# Patient Record
Sex: Male | Born: 1945 | Race: Black or African American | Hispanic: No | Marital: Married | Smoking: Never smoker
Health system: Southern US, Community
[De-identification: ages and names within clinical notes are randomized; demographics above are authoritative.]

## PROBLEM LIST (undated history)

## (undated) DIAGNOSIS — I10 Essential (primary) hypertension: Secondary | ICD-10-CM

## (undated) DIAGNOSIS — N281 Cyst of kidney, acquired: Secondary | ICD-10-CM

## (undated) DIAGNOSIS — K4090 Unilateral inguinal hernia, without obstruction or gangrene, not specified as recurrent: Secondary | ICD-10-CM

## (undated) DIAGNOSIS — T7840XA Allergy, unspecified, initial encounter: Secondary | ICD-10-CM

## (undated) DIAGNOSIS — C61 Malignant neoplasm of prostate: Secondary | ICD-10-CM

## (undated) HISTORY — DX: Cyst of kidney, acquired: N28.1

## (undated) HISTORY — DX: Allergy, unspecified, initial encounter: T78.40XA

## (undated) HISTORY — DX: Essential (primary) hypertension: I10

## (undated) HISTORY — DX: Unilateral inguinal hernia, without obstruction or gangrene, not specified as recurrent: K40.90

## (undated) HISTORY — DX: Malignant neoplasm of prostate: C61

## (undated) HISTORY — PX: HERNIA REPAIR: SHX51

## (undated) HISTORY — PX: PROSTATE SURGERY: SHX751

## (undated) HISTORY — PX: UPPER GASTROINTESTINAL ENDOSCOPY: SHX188

---

## 2019-08-13 ENCOUNTER — Ambulatory Visit (HOSPITAL_COMMUNITY)
Admission: EM | Admit: 2019-08-13 | Discharge: 2019-08-13 | Disposition: A | Discharge status: Home / Self Care | Payer: Medicare HMO | Attending: Family Medicine | Admitting: Family Medicine

## 2019-08-13 ENCOUNTER — Other Ambulatory Visit: Payer: Self-pay

## 2019-08-13 ENCOUNTER — Encounter (HOSPITAL_COMMUNITY): Payer: Self-pay

## 2019-08-13 DIAGNOSIS — I10 Essential (primary) hypertension: Secondary | ICD-10-CM | POA: Diagnosis not present

## 2019-08-13 DIAGNOSIS — R7301 Impaired fasting glucose: Secondary | ICD-10-CM

## 2019-08-13 DIAGNOSIS — G4733 Obstructive sleep apnea (adult) (pediatric): Secondary | ICD-10-CM

## 2019-08-13 DIAGNOSIS — Z8546 Personal history of malignant neoplasm of prostate: Secondary | ICD-10-CM

## 2019-08-13 MED ORDER — DILTIAZEM HCL 30 MG PO TABS: ORAL_TABLET | ORAL | 0 refills | Status: DC

## 2019-08-13 NOTE — Discharge Instructions (Addendum)
Take the BP medicine as directed Call for a primary care appointment tomorrow, to be seen this month

## 2019-08-13 NOTE — ED Triage Notes (Signed)
Pt states his B/P was up about an hour ago ..175/100. pt state he was at the Chiropractic    Office. Pt state he's under a lot of stress.

## 2019-08-13 NOTE — ED Provider Notes (Signed)
[place]    CSN: [id number] Arrival date & time: 08/13/19  1530      History   Chief Complaint Chief Complaint  Patient presents with  . Hypertension    HPI Zachary Barker is a 74 y.o. male.   HPI    Patient is here for hypertension.  He went to a chiropractor today for neck pain.  The chiropractor took his blood pressure and told him it was 180/100.  He recognizes this is a high blood pressure.  He states that he has been under a lot of stress.  He did not have any headache, dizziness, visual symptoms, or nausea.  He does not have any heart disease, with no chest pain or shortness of breath, no pedal edema I reviewed the records from [place].  He saw his internal medicine doctor couple months ago.  His blood pressure at that time was 150/94.  He was taking a natural supplement and trying to work on his diet.  He states he has been on a number of blood pressure medications.  He has tried and failed hydrochlorothiazide, lisinopril, and amlodipine.  They all caused unacceptable side effects for him. He just moved to the [place] area.  He has not yet established with a new primary care doctor. We talked about diet.  Exercise.  Avoiding salt.  The medical complications of awaiting treatment for hypertension, or living with hypertension over time.  Risks of heart disease and stroke, vascular disease. History reviewed. No pertinent past medical history.  Patient Active Problem List   Diagnosis Date Noted  . Essential hypertension 08/13/2019  . History of prostate cancer 08/13/2019  . Impaired fasting glucose 08/13/2019  . OSA (obstructive sleep apnea) 08/13/2019    History reviewed. No pertinent surgical history.     Home Medications    Prior to Admission medications   Medication Sig Start Date End Date Taking? Authorizing Provider  diltiazem (CARDIZEM) 30 MG tablet Take one tab BID for one week then increase to 2 tabs BID 08/13/19   Raylene Everts, MD     Family History History reviewed. No pertinent family history.  Social History Social History   Tobacco Use  . Smoking status: Never Smoker  . Smokeless tobacco: Never Used  Substance Use Topics  . Alcohol use: Never  . Drug use: Never     Allergies   Patient has no known allergies.   Review of Systems Review of Systems  Eyes: Negative for photophobia and visual disturbance.  Cardiovascular: Negative for chest pain and leg swelling.  Gastrointestinal: Negative for nausea.  Musculoskeletal: Positive for neck pain and neck stiffness.  Neurological: Negative for dizziness and headaches.     Physical Exam Triage Vital Signs ED Triage Vitals  Enc Vitals Group     BP 08/13/19 1608 (!) 178/81     Pulse Rate 08/13/19 1608 71     Resp 08/13/19 1608 18     Temp 08/13/19 1608 98.3 F (36.8 C)     Temp src --      SpO2 08/13/19 1608 100 %     Weight 08/13/19 1609 190 lb (86.2 kg)     Height --      Head Circumference --      Peak Flow --      Pain Score 08/13/19 1609 0     Pain Loc --      Pain Edu? --      Excl. in [place]? --  No data found.  Updated Vital Signs BP (!) 178/81 (BP Location: Right Arm)   Pulse 71   Temp 98.3 F (36.8 C)   Resp 18   Wt 86.2 kg   SpO2 100%     Physical Exam Constitutional:      General: He is not in acute distress.    Appearance: Normal appearance. He is well-developed.     Comments: Mild overweight  HENT:     Head: Normocephalic and atraumatic.     Mouth/Throat:     Comments: Mask in place Eyes:     Conjunctiva/sclera: Conjunctivae normal.     Pupils: Pupils are equal, round, and reactive to light.  Cardiovascular:     Rate and Rhythm: Normal rate and regular rhythm.     Heart sounds: Normal heart sounds. No murmur.     Comments: 1 ectopic beat and 60 seconds auscultation Pulmonary:     Effort: Pulmonary effort is normal. No respiratory distress.     Breath sounds: Normal breath sounds.     Comments: Lungs are  clear Abdominal:     General: There is no distension.     Palpations: Abdomen is soft.     Comments: No organomegaly  Musculoskeletal:        General: Normal range of motion.     Cervical back: Normal range of motion.  Skin:    General: Skin is warm and dry.  Neurological:     General: No focal deficit present.     Mental Status: He is alert.  Psychiatric:        Mood and Affect: Mood normal.        Behavior: Behavior normal.      UC Treatments / Results  Labs (all labs ordered are listed, but only abnormal results are displayed) Labs Reviewed - No data to display  EKG   Radiology No results found.  Procedures Procedures (including critical care time)  Medications Ordered in UC Medications - No data to display  Initial Impression / Assessment and Plan / UC Course  I have reviewed the triage vital signs and the nursing notes.  Pertinent labs & imaging results that were available during my care of the patient were reviewed by me and considered in my medical decision making (see chart for details).     See HPI Final Clinical Impressions(s) / UC Diagnoses   Final diagnoses:  Essential hypertension     Discharge Instructions     Take the BP medicine as directed Call for a primary care appointment tomorrow, to be seen this month   ED Prescriptions    Medication Sig Dispense Auth. Provider   diltiazem (CARDIZEM) 30 MG tablet Take one tab BID for one week then increase to 2 tabs BID 120 tablet Raylene Everts, MD     PDMP not reviewed this encounter.   Raylene Everts, MD 08/13/19 1929

## 2019-08-23 ENCOUNTER — Ambulatory Visit: Payer: Medicare HMO | Attending: Internal Medicine

## 2019-08-23 DIAGNOSIS — Z23 Encounter for immunization: Secondary | ICD-10-CM | POA: Insufficient documentation

## 2019-08-23 NOTE — Progress Notes (Signed)
   Covid-19 Vaccination Clinic  Name:  Zachary Barker    MRN: [id number] DOB: 1945-11-01  08/23/2019  [name] was observed post Covid-19 immunization for 15 minutes without incidence. He was provided with Vaccine Information Sheet and instruction to access the V-Safe system.   Mr. Hearing was instructed to call 911 with any severe reactions post vaccine: Marland Kitchen Difficulty breathing  . Swelling of your face and throat  . A fast heartbeat  . A bad rash all over your body  . Dizziness and weakness    Immunizations Administered    Name Date Dose VIS Date Route   Pfizer COVID-19 Vaccine 08/23/2019  2:24 PM 0.3 mL 06/08/2019 Intramuscular   Manufacturer: [place]   Lot: [id number]   [place]: [id number]

## 2019-09-18 ENCOUNTER — Ambulatory Visit: Payer: Medicare HMO | Attending: Internal Medicine

## 2019-09-18 DIAGNOSIS — Z23 Encounter for immunization: Secondary | ICD-10-CM

## 2019-09-18 NOTE — Progress Notes (Signed)
   Covid-19 Vaccination Clinic  Name:  Davy Deberg    MRN: [id number] DOB: 01-Mar-1946  09/18/2019  Mr. Macewan was observed post Covid-19 immunization for 15 minutes without incident. He was provided with Vaccine Information Sheet and instruction to access the V-Safe system.   Mr. Grammatico was instructed to call 911 with any severe reactions post vaccine: Marland Kitchen Difficulty breathing  . Swelling of face and throat  . A fast heartbeat  . A bad rash all over body  . Dizziness and weakness   Immunizations Administered    Name Date Dose VIS Date Route   Pfizer COVID-19 Vaccine 09/18/2019  1:56 PM 0.3 mL 06/08/2019 Intramuscular   Manufacturer: [place]   Lot: [id number]   [place]: [id number]

## 2020-02-20 ENCOUNTER — Other Ambulatory Visit: Payer: Self-pay

## 2020-02-20 ENCOUNTER — Other Ambulatory Visit: Payer: Medicare HMO

## 2020-02-20 DIAGNOSIS — Z20822 Contact with and (suspected) exposure to covid-19: Secondary | ICD-10-CM

## 2020-02-22 LAB — NOVEL CORONAVIRUS, NAA: SARS-CoV-2, NAA: NOT DETECTED

## 2020-02-22 LAB — SARS-COV-2, NAA 2 DAY TAT

## 2021-06-01 ENCOUNTER — Encounter: Payer: Self-pay | Admitting: Gastroenterology

## 2021-06-24 ENCOUNTER — Other Ambulatory Visit: Payer: Self-pay

## 2021-06-25 ENCOUNTER — Other Ambulatory Visit (INDEPENDENT_AMBULATORY_CARE_PROVIDER_SITE_OTHER): Payer: Medicare HMO

## 2021-06-25 ENCOUNTER — Encounter: Payer: Self-pay | Admitting: Gastroenterology

## 2021-06-25 ENCOUNTER — Ambulatory Visit (INDEPENDENT_AMBULATORY_CARE_PROVIDER_SITE_OTHER): Payer: Medicare HMO | Admitting: Gastroenterology

## 2021-06-25 ENCOUNTER — Other Ambulatory Visit: Payer: Self-pay

## 2021-06-25 VITALS — BP 142/82 | HR 80 | Ht 69.0 in | Wt 204.5 lb

## 2021-06-25 DIAGNOSIS — K760 Fatty (change of) liver, not elsewhere classified: Secondary | ICD-10-CM | POA: Diagnosis not present

## 2021-06-25 DIAGNOSIS — R1010 Upper abdominal pain, unspecified: Secondary | ICD-10-CM

## 2021-06-25 DIAGNOSIS — K580 Irritable bowel syndrome with diarrhea: Secondary | ICD-10-CM

## 2021-06-25 NOTE — Progress Notes (Signed)
Chief Complaint: GI complaints  Referring Provider:  No ref. provider found      ASSESSMENT AND PLAN;   #1. Upper/mid abdo pain. Neg [place] 04/01/2020 except for fatty liver  #2. IBS-D with bloating. Neg colon @[place]  2021 (report awaited)  Plan: -CBC, CMP, CRP, TSH, celiac  -Wt loss was emphasized -EGD with SBx -CT AP with PO/IV constrast -Continue probiotics   HPI:    Zachary Barker is a 75 y.o. male   With bloating, upper and mid abdominal pain-not always related to eating.  Describes it as dull, nonradiating, with nocturnal symptoms which would wake him up (x 1 yr).  No decrease in appetite.  He denies having any nausea, heartburn, regurgitation, odynophagia or dysphagia.  No fever chills or night sweats.  No recent weight loss.  In fact he has gained 30 pounds over the last 2 years.  Has been having alternating diarrhea and constipation-more diarrhea which is better over last 1 month since he has been on probiotics.  No nocturnal diarrhea.  No melena or hematochezia.  Denies having any jaundice dark urine or pale stools.  More so ever since he has moved from [place] 2 years ago.  His entire family including 4 children are in [place] and one is in [place].   Doesn't sleep well Urinate 2-3/night  No sodas, chocolates, chewing gums, artificial sweeteners and candy. No NSAIDs.  Has problems with milk not cheese.  No history of history of gluten intolerance.  Off benicar d/t GI S/Es.  SH- married, 5 children- [place]/one [place].  Past GI work-up: Colon 2021 in [place]- neg except small polyps. No need to do in again.  Report awaited. Dx as having IBS there  Past Medical History:  Diagnosis Date   Essential hypertension    Prostate cancer [facility])     Past Surgical History:  Procedure Laterality Date   HERNIA REPAIR     2000   PROSTATE SURGERY     2004    Family History  Problem Relation Age of Onset   Heart disease Mother    Bladder Cancer  Mother    Prostate cancer Father    Diabetes Paternal Grandmother    Diabetes Paternal Grandfather    Stomach cancer Neg Hx    Colon cancer Neg Hx    Rectal cancer Neg Hx    Esophageal cancer Neg Hx    Liver cancer Neg Hx    Pancreatic cancer Neg Hx     Social History   Tobacco Use   Smoking status: Never   Smokeless tobacco: Never  Vaping Use   Vaping Use: Never used  Substance Use Topics   Alcohol use: Not Currently   Drug use: Never    Current Outpatient Medications  Medication Sig Dispense Refill   telmisartan (MICARDIS) 40 MG tablet Take 1 tablet by mouth daily.     No current facility-administered medications for this visit.    No Known Allergies  Review of Systems:  Constitutional: Denies fever, chills, diaphoresis, appetite change and has fatigue.  HEENT: Denies photophobia, eye pain, redness, hearing loss, ear pain, congestion, sore throat, rhinorrhea, sneezing, mouth sores, neck pain, neck stiffness and tinnitus.   Respiratory: Denies SOB, DOE, cough, chest tightness,  and wheezing.   Cardiovascular: Denies chest pain, palpitations and leg swelling.  Genitourinary: Denies dysuria, urgency, frequency, hematuria, flank pain and difficulty urinating.  Musculoskeletal: Denies myalgias, back pain, joint swelling, arthralgias and gait problem.  Skin: No rash.  Neurological:  Denies dizziness, seizures, syncope, weakness, light-headedness, numbness and headaches.  Hematological: Denies adenopathy. Easy bruising, personal or family bleeding history  Psychiatric/Behavioral: Has anxiety, no depression.  Not been sleeping well     Physical Exam:    BP (!) 142/82    Pulse 80    Ht 5\' 9"  (1.753 m)    Wt 204 lb 8 oz (92.8 kg)    SpO2 98%    BMI 30.20 kg/m  Wt Readings from Last 3 Encounters:  06/25/21 204 lb 8 oz (92.8 kg)  08/13/19 190 lb (86.2 kg)   Constitutional:  Well-developed, in no acute distress. Psychiatric: Normal mood and affect. Behavior is  normal. HEENT: Pupils normal.  Conjunctivae are normal. No scleral icterus.  Cardiovascular: Normal rate, regular rhythm. No edema Pulmonary/chest: Effort normal and breath sounds normal. No wheezing, rales or rhonchi. Abdominal: Soft, nondistended. Nontender. Bowel sounds active throughout. There are no masses palpable. No hepatomegaly. Rectal: Deferred Neurological: Alert and oriented to person place and time. Skin: Skin is warm and dry. No rashes noted.  Data Reviewed: I have personally reviewed following labs and imaging studies  Previous labs were reviewed.  He had normal CBC, CMP, PSA.  I do not see TSH.    Carmell Austria, MD 06/25/2021, 3:24 PM  Cc: No ref. provider found

## 2021-06-25 NOTE — Patient Instructions (Signed)
If you are age 73 or older, your body mass index should be between 23-30. Your Body mass index is 30.2 kg/m. If this is out of the aforementioned range listed, please consider follow up with your Primary Care Provider.  If you are age 73 or younger, your body mass index should be between 19-25. Your Body mass index is 30.2 kg/m. If this is out of the aformentioned range listed, please consider follow up with your Primary Care Provider.   ________________________________________________________  The [place] [facility] providers would like to encourage you to use [facility] to communicate with providers for non-urgent requests or questions.  Due to long hold times on the telephone, sending your provider a message by [facility] may be a faster and more efficient way to get a response.  Please allow 48 business hours for a response.  Please remember that this is for non-urgent requests.  _______________________________________________________  Please go to the lab on the 2nd floor [address] before you leave the office today.   You have been scheduled for an endoscopy. Please follow written instructions given to you at your visit today. If you use inhalers (even only as needed), please bring them with you on the day of your procedure.  Continue probiotics   Please call in 3 business days to schedule this Please have this completed by 07-22-2021 to avoid more lab work You have been scheduled for a CT scan of the abdomen and pelvis at [facility] ([address] [place], [place] [address] [address]).   You are scheduled on         at                . You should arrive 15 minutes prior to your appointment time for registration. Please follow the written instructions below on the day of your exam:  WARNING: IF YOU ARE ALLERGIC TO IODINE/X-RAY DYE, PLEASE NOTIFY RADIOLOGY IMMEDIATELY AT [phone]! YOU WILL BE GIVEN A 13 HOUR PREMEDICATION PREP.  1) Do not eat or drink anything after            (4 hours prior to your test) 2) You have been given 2 bottles of oral contrast to drink. The solution may taste better if refrigerated, but do NOT add ice or any other liquid to this solution. Shake well before drinking.    Drink 1 bottle of contrast @          (2 hours prior to your exam)  Drink 1 bottle of contrast @          (1 hour prior to your exam)  You may take any medications as prescribed with a small amount of water, if necessary. If you take any of the following medications: METFORMIN, GLUCOPHAGE, GLUCOVANCE, AVANDAMET, RIOMET, FORTAMET, Lilly MET, JANUMET, GLUMETZA or METAGLIP, you MAY be asked to HOLD this medication 48 hours AFTER the exam.  The purpose of you drinking the oral contrast is to aid in the visualization of your intestinal tract. The contrast solution may cause some diarrhea. Depending on your individual set of symptoms, you may also receive an intravenous injection of x-ray contrast/dye. Plan on being at [facility] for 30 minutes or longer, depending on the type of exam you are having performed.  This test typically takes 30-45 minutes to complete.  If you have any questions regarding your exam or if you need to reschedule, you may call the CT department at [phone] between the hours of 8:00  am and 5:00 pm, Monday-Friday.  ________________________________________________________________________  Thank you,  Dr. Jackquline Denmark

## 2021-06-26 LAB — CBC WITH DIFFERENTIAL/PLATELET
Basophils Absolute: 0.1 10*3/uL (ref 0.0–0.1)
Basophils Relative: 0.9 % (ref 0.0–3.0)
Eosinophils Absolute: 0.1 10*3/uL (ref 0.0–0.7)
Eosinophils Relative: 1.6 % (ref 0.0–5.0)
HCT: 41.9 % (ref 39.0–52.0)
Hemoglobin: 14.1 g/dL (ref 13.0–17.0)
Lymphocytes Relative: 27.4 % (ref 12.0–46.0)
Lymphs Abs: 1.8 10*3/uL (ref 0.7–4.0)
MCHC: 33.6 g/dL (ref 30.0–36.0)
MCV: 91.8 fl (ref 78.0–100.0)
Monocytes Absolute: 0.8 10*3/uL (ref 0.1–1.0)
Monocytes Relative: 12.8 % — ABNORMAL HIGH (ref 3.0–12.0)
Neutro Abs: 3.8 10*3/uL (ref 1.4–7.7)
Neutrophils Relative %: 57.3 % (ref 43.0–77.0)
Platelets: 210 10*3/uL (ref 150.0–400.0)
RBC: 4.56 Mil/uL (ref 4.22–5.81)
RDW: 13.7 % (ref 11.5–15.5)
WBC: 6.6 10*3/uL (ref 4.0–10.5)

## 2021-06-26 LAB — COMPREHENSIVE METABOLIC PANEL
ALT: 31 U/L (ref 0–53)
AST: 20 U/L (ref 0–37)
Albumin: 4.5 g/dL (ref 3.5–5.2)
Alkaline Phosphatase: 46 U/L (ref 39–117)
BUN: 18 mg/dL (ref 6–23)
CO2: 30 mEq/L (ref 19–32)
Calcium: 9.5 mg/dL (ref 8.4–10.5)
Chloride: 101 mEq/L (ref 96–112)
Creatinine, Ser: 1.09 mg/dL (ref 0.40–1.50)
GFR: 67.56 mL/min (ref >60.00)
Glucose, Bld: 95 mg/dL (ref 70–99)
Potassium: 4 mEq/L (ref 3.5–5.1)
Sodium: 139 mEq/L (ref 135–145)
Total Bilirubin: 0.4 mg/dL (ref 0.2–1.2)
Total Protein: 7.2 g/dL (ref 6.0–8.3)

## 2021-06-26 LAB — C-REACTIVE PROTEIN: CRP: <1 mg/dL (ref 0.5–20.0)

## 2021-06-26 LAB — TSH: TSH: 1.82 u[IU]/mL (ref 0.35–5.50)

## 2021-06-28 LAB — CELIAC PANEL 10
Antigliadin Abs, IgA: 3 units (ref 0–19)
Endomysial IgA: NEGATIVE
Gliadin IgG: 4 units (ref 0–19)
IgA/Immunoglobulin A, Serum: 204 mg/dL (ref 61–437)
Tissue Transglut Ab: 4 U/mL (ref 0–5)
Transglutaminase IgA: <2 U/mL (ref 0–3)

## 2021-07-09 ENCOUNTER — Ambulatory Visit (HOSPITAL_BASED_OUTPATIENT_CLINIC_OR_DEPARTMENT_OTHER)
Admission: RE | Admit: 2021-07-09 | Discharge: 2021-07-09 | Disposition: A | Discharge status: Home / Self Care | Payer: Medicare HMO | Source: Ambulatory Visit | Attending: Gastroenterology | Admitting: Gastroenterology

## 2021-07-09 ENCOUNTER — Other Ambulatory Visit: Payer: Self-pay

## 2021-07-09 ENCOUNTER — Encounter (HOSPITAL_BASED_OUTPATIENT_CLINIC_OR_DEPARTMENT_OTHER): Payer: Self-pay

## 2021-07-09 DIAGNOSIS — K580 Irritable bowel syndrome with diarrhea: Secondary | ICD-10-CM | POA: Diagnosis present

## 2021-07-09 DIAGNOSIS — K760 Fatty (change of) liver, not elsewhere classified: Secondary | ICD-10-CM | POA: Diagnosis present

## 2021-07-09 DIAGNOSIS — R1010 Upper abdominal pain, unspecified: Secondary | ICD-10-CM | POA: Diagnosis present

## 2021-07-09 MED ORDER — IOHEXOL 300 MG/ML  SOLN
100.0000 mL | Freq: Once | INTRAMUSCULAR | Status: AC | PRN
  Administered 2021-07-09: 100 mL via INTRAVENOUS

## 2021-07-29 ENCOUNTER — Encounter: Payer: Self-pay | Admitting: Gastroenterology

## 2021-07-29 ENCOUNTER — Ambulatory Visit (AMBULATORY_SURGERY_CENTER): Payer: Medicare HMO | Admitting: Gastroenterology

## 2021-07-29 ENCOUNTER — Other Ambulatory Visit: Payer: Self-pay

## 2021-07-29 VITALS — BP 135/80 | HR 70 | Temp 97.8°F | Resp 16 | Ht 69.0 in | Wt 204.0 lb

## 2021-07-29 DIAGNOSIS — R1010 Upper abdominal pain, unspecified: Secondary | ICD-10-CM

## 2021-07-29 DIAGNOSIS — K2091 Esophagitis, unspecified with bleeding: Secondary | ICD-10-CM

## 2021-07-29 DIAGNOSIS — K2970 Gastritis, unspecified, without bleeding: Secondary | ICD-10-CM

## 2021-07-29 DIAGNOSIS — B9681 Helicobacter pylori [H. pylori] as the cause of diseases classified elsewhere: Secondary | ICD-10-CM

## 2021-07-29 DIAGNOSIS — K2210 Ulcer of esophagus without bleeding: Secondary | ICD-10-CM

## 2021-07-29 DIAGNOSIS — K209 Esophagitis, unspecified without bleeding: Secondary | ICD-10-CM | POA: Diagnosis not present

## 2021-07-29 DIAGNOSIS — K2950 Unspecified chronic gastritis without bleeding: Secondary | ICD-10-CM | POA: Diagnosis not present

## 2021-07-29 MED ORDER — PANTOPRAZOLE SODIUM 40 MG PO TBEC: DELAYED_RELEASE_TABLET | ORAL | 6 refills | Status: DC

## 2021-07-29 MED ORDER — SODIUM CHLORIDE 0.9 % IV SOLN: 500.0000 mL | Freq: Once | INTRAVENOUS | Status: DC

## 2021-07-29 NOTE — Progress Notes (Signed)
Chief Complaint: GI complaints  Referring Provider:  Jackquline Denmark, MD      ASSESSMENT AND PLAN;   #1. Upper/mid abdo pain. Neg [place] 04/01/2020 except for fatty liver  #2. IBS-D with bloating. Neg colon @[place]  2021 (report awaited)  Plan: -CBC, CMP, CRP, TSH, celiac  -Wt loss was emphasized -EGD with SBx -CT AP with PO/IV constrast -Continue probiotics   HPI:    Zachary Barker is a 76 y.o. male   With bloating, upper and mid abdominal pain-not always related to eating.  Describes it as dull, nonradiating, with nocturnal symptoms which would wake him up (x 1 yr).  No decrease in appetite.  He denies having any nausea, heartburn, regurgitation, odynophagia or dysphagia.  No fever chills or night sweats.  No recent weight loss.  In fact he has gained 30 pounds over the last 2 years.  Has been having alternating diarrhea and constipation-more diarrhea which is better over last 1 month since he has been on probiotics.  No nocturnal diarrhea.  No melena or hematochezia.  Denies having any jaundice dark urine or pale stools.  More so ever since he has moved from [place] 2 years ago.  His entire family including 4 children are in [place] and one is in [place].   Doesn't sleep well Urinate 2-3/night  No sodas, chocolates, chewing gums, artificial sweeteners and candy. No NSAIDs.  Has problems with milk not cheese.  No history of history of gluten intolerance.  Off benicar d/t GI S/Es.  SH- married, 5 children- [place]/one [place].  Past GI work-up: Colon 2021 in [place]- neg except small polyps. No need to do in again.  Report awaited. Dx as having IBS there  Past Medical History:  Diagnosis Date   Allergy    Essential hypertension    Inguinal hernia    left   Kidney cysts    Prostate cancer [facility])     Past Surgical History:  Procedure Laterality Date   HERNIA REPAIR     2000   PROSTATE SURGERY     2004   UPPER GASTROINTESTINAL ENDOSCOPY       Family History  Problem Relation Age of Onset   Heart disease Mother    Bladder Cancer Mother    Prostate cancer Father    Diabetes Paternal Grandmother    Diabetes Paternal Grandfather    Stomach cancer Neg Hx    Colon cancer Neg Hx    Rectal cancer Neg Hx    Esophageal cancer Neg Hx    Liver cancer Neg Hx    Pancreatic cancer Neg Hx     Social History   Tobacco Use   Smoking status: Never   Smokeless tobacco: Never  Vaping Use   Vaping Use: Never used  Substance Use Topics   Alcohol use: Not Currently   Drug use: Never    Current Outpatient Medications  Medication Sig Dispense Refill   telmisartan (MICARDIS) 40 MG tablet Take 1 tablet by mouth daily.     Current Facility-Administered Medications  Medication Dose Route Frequency Provider Last Rate Last Admin   0.9 %  sodium chloride infusion  500 mL Intravenous Once Jackquline Denmark, MD        Allergies  Allergen Reactions   Amlodipine     Other reaction(s): Constipation, Dizziness Headache   Hydrochlorothiazide     Other reaction(s): Fatigue, Other Fatigue    Lisinopril     Other reaction(s): Fatigue, Other Fatigue    Diltiazem  Other reaction(s): Constipation Lightheadedness    Review of Systems:  Constitutional: Denies fever, chills, diaphoresis, appetite change and has fatigue.  HEENT: Denies photophobia, eye pain, redness, hearing loss, ear pain, congestion, sore throat, rhinorrhea, sneezing, mouth sores, neck pain, neck stiffness and tinnitus.   Respiratory: Denies SOB, DOE, cough, chest tightness,  and wheezing.   Cardiovascular: Denies chest pain, palpitations and leg swelling.  Genitourinary: Denies dysuria, urgency, frequency, hematuria, flank pain and difficulty urinating.  Musculoskeletal: Denies myalgias, back pain, joint swelling, arthralgias and gait problem.  Skin: No rash.  Neurological: Denies dizziness, seizures, syncope, weakness, light-headedness, numbness and headaches.   Hematological: Denies adenopathy. Easy bruising, personal or family bleeding history  Psychiatric/Behavioral: Has anxiety, no depression.  Not been sleeping well     Physical Exam:    BP (!) 150/90 Comment: manually   Pulse 70    Temp 97.8 F (36.6 C)    Ht 5\' 9"  (1.753 m)    Wt 204 lb (92.5 kg)    SpO2 99%    BMI 30.13 kg/m  Wt Readings from Last 3 Encounters:  07/29/21 204 lb (92.5 kg)  06/25/21 204 lb 8 oz (92.8 kg)  08/13/19 190 lb (86.2 kg)   Constitutional:  Well-developed, in no acute distress. Psychiatric: Normal mood and affect. Behavior is normal. HEENT: Pupils normal.  Conjunctivae are normal. No scleral icterus.  Cardiovascular: Normal rate, regular rhythm. No edema Pulmonary/chest: Effort normal and breath sounds normal. No wheezing, rales or rhonchi. Abdominal: Soft, nondistended. Nontender. Bowel sounds active throughout. There are no masses palpable. No hepatomegaly. Rectal: Deferred Neurological: Alert and oriented to person place and time. Skin: Skin is warm and dry. No rashes noted.  Data Reviewed: I have personally reviewed following labs and imaging studies  Previous labs were reviewed.  He had normal CBC, CMP, PSA.  I do not see TSH.    Carmell Austria, MD 07/29/2021, 3:18 PM  Cc: Jackquline Denmark, MD

## 2021-07-29 NOTE — Progress Notes (Signed)
Pt in recovery with monitors in place, VSS. Report given to receiving RN. Bite guard was placed with pt awake to ensure comfort. No dental or soft tissue damage noted. 

## 2021-07-29 NOTE — Progress Notes (Signed)
[facility]  

## 2021-07-29 NOTE — Patient Instructions (Signed)
Await biopsy results    Start Protonix 40 mg by mouth once a day ( 30 minutes before breakfast ) #30, 6 refills sent order to your pharmacy for you to pick up    [place]:   Refer to the procedure report that was given to you for any specific questions about what was found during the examination.  If the procedure report does not answer your questions, please call your gastroenterologist to clarify.  If you requested that your care partner not be given the details of your procedure findings, then the procedure report has been included in a sealed envelope for you to review at your convenience later.  YOU SHOULD EXPECT: Some feelings of bloating in the abdomen. Passage of more gas than usual.  Walking can help get rid of the air that was put into your GI tract during the procedure and reduce the bloating. If you had a lower endoscopy (such as a colonoscopy or flexible sigmoidoscopy) you may notice spotting of blood in your stool or on the toilet paper. If you underwent a bowel prep for your procedure, you may not have a normal bowel movement for a few days.  Please Note:  You might notice some irritation and congestion in your nose or some drainage.  This is from the oxygen used during your procedure.  There is no need for concern and it should clear up in a day or so.  SYMPTOMS TO REPORT IMMEDIATELY:  Following upper endoscopy (EGD)  Vomiting of blood or coffee ground material  New chest pain or pain under the shoulder blades  Painful or persistently difficult swallowing  New shortness of breath  Fever of 100F or higher  Black, tarry-looking stools  For urgent or emergent issues, a gastroenterologist can be reached at any hour by calling [phone]. Do not use MyChart messaging for urgent concerns.    DIET:  We do recommend a small meal at first, but then you may proceed to your regular diet.  Drink plenty of fluids but  you should avoid alcoholic beverages for 24 hours.  ACTIVITY:  You should plan to take it easy for the rest of today and you should NOT DRIVE or use heavy machinery until tomorrow (because of the sedation medicines used during the test).    FOLLOW UP: Our staff will call the number listed on your records 48-72 hours following your procedure to check on you and address any questions or concerns that you may have regarding the information given to you following your procedure. If we do not reach you, we will leave a message.  We will attempt to reach you two times.  During this call, we will ask if you have developed any symptoms of COVID 19. If you develop any symptoms (ie: fever, flu-like symptoms, shortness of breath, cough etc.) before then, please call [phone].  If you test positive for Covid 19 in the 2 weeks post procedure, please call and report this information to [place].    If any biopsies were taken you will be contacted by phone or by letter within the next 1-3 weeks.  Please call us at [phone] if you have not heard about the biopsies in 3 weeks.    SIGNATURES/CONFIDENTIALITY: You and/or your care partner have signed paperwork which will be entered into your electronic medical record.  These signatures attest to the fact that that the information above on your After Visit  Summary has been reviewed and is understood.  Full responsibility of the confidentiality of this discharge information lies with you and/or your care-partner.

## 2021-07-29 NOTE — Progress Notes (Signed)
Called to room to assist during endoscopic procedure.  Patient ID and intended procedure confirmed with present staff. Received instructions for my participation in the procedure from the performing physician.  

## 2021-07-29 NOTE — Op Note (Signed)
[place] Patient Name: Zachary Barker Procedure Date: 07/29/2021 3:13 PM MRN: [id number] Endoscopist: Jackquline Denmark , MD Age: 76 Referring MD:  Date of Birth: August 07, 1945 Gender: Male Account #: [id number] Procedure:                Upper GI endoscopy Indications:              Epigastric abdominal pain Medicines:                Monitored Anesthesia Care Procedure:                Pre-Anesthesia Assessment:                           - Prior to the procedure, a History and Physical                            was performed, and patient medications and                            allergies were reviewed. The patient's tolerance of                            previous anesthesia was also reviewed. The risks                            and benefits of the procedure and the sedation                            options and risks were discussed with the patient.                            All questions were answered, and informed consent                            was obtained. Prior Anticoagulants: The patient has                            taken no previous anticoagulant or antiplatelet                            agents. ASA Grade Assessment: II - A patient with                            mild systemic disease. After reviewing the risks                            and benefits, the patient was deemed in                            satisfactory condition to undergo the procedure.                           After obtaining informed consent, the endoscope was  passed under direct vision. Throughout the                            procedure, the patient's blood pressure, pulse, and                            oxygen saturations were monitored continuously. The                            Endoscope was introduced through the mouth, and                            advanced to the second part of duodenum. The upper                            GI endoscopy was accomplished  without difficulty.                            The patient tolerated the procedure well. Scope In: Scope Out: Findings:                 Localized, few white plaques were found in the                            upper third of the esophagus. Biopsies were taken                            with a cold forceps for histology to r/o Candida.                           One tongue of salmon-colored mucosa was visualized                            extending from 39 cm to 40 cm ([place]),                            examined by NBI. Healed distal esophageal erosions.                            Biopsies were taken with a cold forceps for                            histology.                           The entire examined stomach was J-shaped but                            normal. Biopsies were taken with a cold forceps for                            histology.                           The  examined duodenum was normal. Biopsies for                            histology were taken with a cold forceps for                            evaluation of celiac disease. Complications:            No immediate complications. Estimated Blood Loss:     Estimated blood loss: none. Impression:               - Few minimal esophageal plaques (? mild                            candidiasis). Biopsied.                           - Distal esophageal erosions with salmon-colored                            mucosa. Biopsied to r/o short segment Barrett's                            esophagus.                           - Otherwise normal EGD. Recommendation:           - Patient has a contact number available for                            emergencies. The signs and symptoms of potential                            delayed complications were discussed with the                            patient. Return to normal activities tomorrow.                            Written discharge instructions were provided to the                             patient.                           - Resume previous diet.                           - Continue present medications.                           - Start Protonix 40 mg p.o. once a day, 1/2 hr                            before breakfast #30, 6RF                           -  Await pathology results.                           - The findings and recommendations were discussed                            with the patient's family. Jackquline Denmark, MD 07/29/2021 3:43:03 PM This report has been signed electronically.

## 2021-07-31 ENCOUNTER — Telehealth: Payer: Self-pay

## 2021-07-31 NOTE — Telephone Encounter (Signed)
°  Follow up Call-  Call back number 07/29/2021  Post procedure Call Back phone  # [phone]  Permission to leave phone message Yes     Patient questions:  Do you have a fever, pain , or abdominal swelling? No. Pain Score  0 *  Have you tolerated food without any problems? Yes.    Have you been able to return to your normal activities? Yes.    Do you have any questions about your discharge instructions: Diet   No. Medications  No. Follow up visit  No.  Do you have questions or concerns about your Care? No.  Actions: * If pain score is 4 or above: No action needed, pain <4.  Have you developed a fever since your procedure? no  2.   Have you had an respiratory symptoms (SOB or cough) since your procedure? no  3.   Have you tested positive for COVID 19 since your procedure no  4.   Have you had any family members/close contacts diagnosed with the COVID 19 since your procedure?  no   If yes to any of these questions please route to Joylene John, RN and Joella Prince, RN

## 2021-08-08 ENCOUNTER — Encounter: Payer: Self-pay | Admitting: Gastroenterology

## 2021-08-08 NOTE — Progress Notes (Signed)
Zachary Barker, can you let the pt know that gastric bx are + for H pylori. Would recommend the following treatment regimen for 14 days: Amoxicillin 1gm BID Clarithromycin 500mg  BID Flagyl 500mg  BID Protonix 40 BID   Can you please order this if no signfiicant interactions noted. Once done with therapy, the patient should wait one month and perform a stool study for H pylori antigen to ensure negative. The PPI needs to be held at least 2 weeks prior to submitting the stool sample. The patient should avoid alcohol while taking Flagyl.   Thanks  Dr Lyndel Safe

## 2021-08-10 ENCOUNTER — Other Ambulatory Visit: Payer: Self-pay

## 2021-08-10 DIAGNOSIS — B9681 Helicobacter pylori [H. pylori] as the cause of diseases classified elsewhere: Secondary | ICD-10-CM

## 2021-08-10 DIAGNOSIS — K2970 Gastritis, unspecified, without bleeding: Secondary | ICD-10-CM

## 2021-08-10 MED ORDER — PANTOPRAZOLE SODIUM 40 MG PO TBEC: 40.0000 mg | DELAYED_RELEASE_TABLET | Freq: Two times a day (BID) | ORAL | 0 refills | Status: DC

## 2021-08-10 MED ORDER — AMOXICILLIN 500 MG PO CAPS: 1000.0000 mg | ORAL_CAPSULE | Freq: Two times a day (BID) | ORAL | 0 refills | Status: AC

## 2021-08-10 MED ORDER — CLARITHROMYCIN 500 MG PO TABS: 500.0000 mg | ORAL_TABLET | Freq: Two times a day (BID) | ORAL | 0 refills | Status: AC

## 2021-08-10 MED ORDER — METRONIDAZOLE 500 MG PO TABS: 500.0000 mg | ORAL_TABLET | Freq: Two times a day (BID) | ORAL | 0 refills | Status: AC

## 2021-08-11 ENCOUNTER — Telehealth: Payer: Self-pay | Admitting: Gastroenterology

## 2021-08-11 NOTE — Telephone Encounter (Signed)
Pt made aware of results and Dr. Lyndel Safe recommendations Prescriptions sent to pharmacy: Pt made aware: Order for H Pylori antigen placed in Epic. Pt made aware Pt given detailed Instructions: Letter was sent to pt via My Chart with detailed instructions Pt verbalized understanding with all questions answered.

## 2021-08-11 NOTE — Telephone Encounter (Signed)
Inbound call from patient stating that he received a letter in the mail about having H. Pylori. Stated that he needed to get antibiotics. Please advise.

## 2021-09-17 ENCOUNTER — Other Ambulatory Visit: Payer: Medicare HMO

## 2021-09-18 ENCOUNTER — Other Ambulatory Visit: Payer: Medicare HMO

## 2021-09-18 DIAGNOSIS — B9681 Helicobacter pylori [H. pylori] as the cause of diseases classified elsewhere: Secondary | ICD-10-CM

## 2021-09-20 LAB — H. PYLORI ANTIGEN, STOOL: H pylori Ag, Stl: NEGATIVE

## 2023-08-09 ENCOUNTER — Ambulatory Visit: Payer: Medicare HMO | Admitting: Nurse Practitioner

## 2023-09-01 ENCOUNTER — Telehealth: Payer: Self-pay | Admitting: Physician Assistant

## 2023-09-01 NOTE — Telephone Encounter (Signed)
**Note De-identified  [place] Obfuscation** Please advise 

## 2023-09-01 NOTE — Telephone Encounter (Signed)
 Inbound call from patient requesting to know if he is able to have 3/12 appointment virtual. States it will be easier for him due to living in [place]. Please advise, thank you.

## 2023-09-02 NOTE — Telephone Encounter (Signed)
 FYI

## 2023-09-02 NOTE — Telephone Encounter (Signed)
 Bloating and IBS

## 2023-09-06 NOTE — Telephone Encounter (Signed)
 Called patient to inform. Left voicemail advising appointment will be virtual.

## 2023-09-06 NOTE — Telephone Encounter (Signed)
 Noted! Thank you

## 2023-09-07 ENCOUNTER — Telehealth: Payer: Self-pay | Admitting: Physician Assistant

## 2023-09-07 ENCOUNTER — Encounter: Payer: Self-pay | Admitting: Physician Assistant

## 2023-09-07 ENCOUNTER — Telehealth: Payer: Medicare HMO | Admitting: Physician Assistant

## 2023-09-07 ENCOUNTER — Telehealth: Payer: Self-pay

## 2023-09-07 DIAGNOSIS — R140 Abdominal distension (gaseous): Secondary | ICD-10-CM

## 2023-09-07 DIAGNOSIS — K580 Irritable bowel syndrome with diarrhea: Secondary | ICD-10-CM | POA: Diagnosis not present

## 2023-09-07 DIAGNOSIS — R143 Flatulence: Secondary | ICD-10-CM

## 2023-09-07 NOTE — Telephone Encounter (Signed)
 [name], [place] are scheduled for a virtual visit with your provider today.    Just as we do with appointments in the office, we must obtain your consent to participate.  Your consent will be active for this visit and any virtual visit you may have with one of our providers in the next 365 days.    If you have a MyChart account, I can also send a copy of this consent to you electronically.  All virtual visits are billed to your insurance company just like a traditional visit in the office.  As this is a virtual visit, video technology does not allow for your provider to perform a traditional examination.  This may limit your provider's ability to fully assess your condition.  If your provider identifies any concerns that need to be evaluated in person or the need to arrange testing such as labs, EKG, etc, we will make arrangements to do so.    Although advances in technology are sophisticated, we cannot ensure that it will always work on either your end or our end.  If the connection with a video visit is poor, we may have to switch to a telephone visit.  With either a video or telephone visit, we are not always able to ensure that we have a secure connection.   I need to obtain your verbal consent now.   Are you willing to proceed with your visit today?   Zachary Barker has provided verbal consent on 09/07/2023 for a virtual visit (video or telephone).   [name] [place], [place] 09/07/2023  10:57 AM

## 2023-09-07 NOTE — Patient Instructions (Addendum)
 Mr. Bergum,  As we discussed my nursing staff will be calling you in regards to testing for H. pylori via breath testing and as well as SIBO.  Once we have those results if negative/normal we can further discuss a colonoscopy if needed.  It was a pleasure to see you today.  Sincerely, Hyacinth Meeker, PA-C  You have been given a testing kit to check for small intestine bacterial overgrowth (SIBO) which is completed by a company named [facility]. Make sure to return your test in the mail using the return mailing label given to you along with the kit. The test order, your demographic and insurance information have all already been sent to the company. [facility] will collect an upfront charge of $99.74 for commercial insurance plans and $209.74 if you are paying cash. Make sure to discuss with [facility] PRIOR to having the test to see if they have gotten information from your insurance company as to how much your testing will cost out of pocket, if any. Please contact [facility] at phone number [phone] to get instructions regarding how to perform the test as our office is unable to give specific testing instructions.

## 2023-09-07 NOTE — Telephone Encounter (Signed)
 I spoke to Zachary Barker and I advised him that his lab orders were available for pick up.  I told him to come to the 3rd floor front desk.  He said that he will come by tomorrow to pick up the lab orders and the kit.

## 2023-09-07 NOTE — Progress Notes (Signed)
 This service was provided via telemedicine.  The patient was located at home.  The provider was located in office.  The patient did consent to this telephone visit and is aware of possible charges through their insurance for this visit.   Time spent on call: 15   Chief Complaint: Bloating and diarrhea  HPI:    Zachary Barker is a 78 year old male with a past medical history as listed below, known to Dr. Chales Abrahams, who is seen today via telemedicine for a complaint of bloating and diarrhea.    06/25/2021 office visit with Dr. 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  At that time reported negative colonoscopy in [place] in 2021.  Labs were ordered including CBC, CMP, CRP and TSH as well as celiac panel.  EGD recommended with small bowel biopsies.  CT ordered.    11/23/2020 celiac testing negative.  TSH normal.  CRP normal.    07/09/2021 CT that and pelvis with left colon/sigmoid colon diverticulosis without acute diverticulitis and multiple benign renal cyst as well as post prostatectomy.    07/29/2021 EGD with a few minimal esophageal plaques and distal esophageal erosions.  Biopsy showed chronic gastritis and chronic esophagitis as well as positive for H. pylori.  Patient treated at that time with triple therapy.    09/14/2021 H. pylori stool antigen negative.    Today, the patient explains that since he was treated for H. pylori he feels like symptoms that he was having at the time actually got worse.  Ever since then he has had increased bloating and gas as well as continued with frequent bowel movements which he tells me really have not changed given his history of IBS-D.  He has tried some probiotics that he ordered offline after some "testing", but this did not help either.  He has been actively trying to lose some weight and get off of all of his medications, currently only on 1 medicine for hypertension.  His current weight is 176 per him but his BP was still high 150/98 his most  recent office visit a few weeks ago.  Denies any heartburn, reflux, nausea or vomiting.    Denies fever, chills or weight loss.    Past Medical History:  Diagnosis Date   Allergy    Essential hypertension    Inguinal hernia    left   Kidney cysts    Prostate cancer [facility])     Past Surgical History:  Procedure Laterality Date   HERNIA REPAIR     2000   PROSTATE SURGERY     2004   UPPER GASTROINTESTINAL ENDOSCOPY      Current Outpatient Medications  Medication Sig Dispense Refill   pantoprazole (PROTONIX) 40 MG tablet Protonix 40 mg by mouth daily ( 30 minutes before breakfast) 30 tablet 6   pantoprazole (PROTONIX) 40 MG tablet Take 1 tablet (40 mg total) by mouth 2 (two) times daily for 14 days. 28 tablet 0   telmisartan (MICARDIS) 40 MG tablet Take 1 tablet by mouth daily.     No current facility-administered medications for this visit.    Allergies as of 09/07/2023 - Review Complete 07/29/2021  Allergen Reaction Noted   Amlodipine  10/11/2019   Hydrochlorothiazide  03/06/2019   Lisinopril  03/06/2019   Diltiazem  10/11/2019    Family History  Problem Relation Age of Onset   Heart disease Mother    Bladder Cancer Mother    Prostate cancer Father    Diabetes Paternal  Grandmother    Diabetes Paternal Grandfather    Stomach cancer Neg Hx    Colon cancer Neg Hx    Rectal cancer Neg Hx    Esophageal cancer Neg Hx    Liver cancer Neg Hx    Pancreatic cancer Neg Hx     Social History   Socioeconomic History   Marital status: Married    Spouse name: Not on file   Number of children: 1   Years of education: Not on file   Highest education level: Not on file  Occupational History   Not on file  Tobacco Use   Smoking status: Never   Smokeless tobacco: Never  Vaping Use   Vaping status: Never Used  Substance and Sexual Activity   Alcohol use: Not Currently   Drug use: Never   Sexual activity: Not on file  Other Topics Concern   Not on file  Social History  Narrative   Not on file   Social Drivers of Health   Financial Resource Strain: Low Risk  (09/04/2023)   Received from [facility]   Overall Financial Resource Strain (CARDIA)    Difficulty of Paying Living Expenses: Not hard at all  Food Insecurity: No Food Insecurity (09/04/2023)   Received from [facility]   Hunger Vital Sign    Worried About Running Out of Food in the Last Year: Never true    Ran Out of Food in the Last Year: Never true  Transportation Needs: No Transportation Needs (09/04/2023)   Received from [facility]   PRAPARE - Transportation    Lack of Transportation (Medical): No    Lack of Transportation (Non-Medical): No  Physical Activity: Insufficiently Active (11/23/2022)   Received from [facility]   Exercise Vital Sign    Days of Exercise per Week: 3 days    Minutes of Exercise per Session: 30 min  Stress: No Stress Concern Present (11/23/2022)   Received from [facility] - Occupational Stress Questionnaire    Feeling of Stress : Only a little  Social Connections: Socially Integrated (11/23/2022)   Received from [facility]   Social Network    How would you rate your social network (family, work, friends)?: Good participation with social networks  Intimate Partner Violence: Not At Risk (11/23/2022)   Received from [facility]   HITS    Over the last 12 months how often did your partner physically hurt you?: Never    Over the last 12 months how often did your partner insult you or talk down to you?: Never    Over the last 12 months how often did your partner threaten you with physical harm?: Never    Over the last 12 months how often did your partner scream or curse at you?: Never    Review of Systems:    Constitutional: No weight loss, fever or chills Cardiovascular: No chest pain Respiratory: No SOB Gastrointestinal: See HPI and otherwise negative   Physical Exam:  Vital signs: Telemedicine  visit-no vitals.  Patient reports a recent weight of 176, no change in height and a BP high at 150/90 at last check.  Constitutional:   Pleasant male appears to be in NAD, Well developed, Well nourished, alert and cooperative Head:  Normocephalic and atraumatic. Ears:  Normal auditory acuity. Neurologic:  Alert and  oriented x4 Psychiatric: Demonstrates good judgement and reason without abnormal affect or behaviors.  RELEVANT LABS AND IMAGING: CBC  Component Value Date/Time   WBC 6.6 06/25/2021 1556   RBC 4.56 06/25/2021 1556   HGB 14.1 06/25/2021 1556   HCT 41.9 06/25/2021 1556   PLT 210.0 06/25/2021 1556   MCV 91.8 06/25/2021 1556   MCHC 33.6 06/25/2021 1556   RDW 13.7 06/25/2021 1556   LYMPHSABS 1.8 06/25/2021 1556   MONOABS 0.8 06/25/2021 1556   EOSABS 0.1 06/25/2021 1556   BASOSABS 0.1 06/25/2021 1556    CMP     Component Value Date/Time   NA 139 06/25/2021 1556   K 4.0 06/25/2021 1556   CL 101 06/25/2021 1556   CO2 30 06/25/2021 1556   GLUCOSE 95 06/25/2021 1556   BUN 18 06/25/2021 1556   CREATININE 1.09 06/25/2021 1556   CALCIUM 9.5 06/25/2021 1556   PROT 7.2 06/25/2021 1556   ALBUMIN 4.5 06/25/2021 1556   AST 20 06/25/2021 1556   ALT 31 06/25/2021 1556   ALKPHOS 46 06/25/2021 1556   BILITOT 0.4 06/25/2021 1556    Assessment: 1.  Bloating: With increased gas over the past couple of years, seems to be worse since treatment of H. pylori 2.  Diarrhea: Diagnosed with IBS-D years ago, no change recently, but still problematic for the patient  Plan: 1.  Patient truly wonders if his H. pylori infection is gone or if he had false negative testing afterwards or if maybe it has come back.  Will go ahead and order a repeat H. pylori breath test. 2.  Will also order SIBO breath test given ongoing bloating gas and change in bowel habits as well as recent use of high powered probiotics. 3.  Discussed possible medicines like antispasmodics or other for IBS but patient is  really not interested in starting on other medications.  If testing is negative above explained that we could proceed with repeat colonoscopy given worsening of symptoms lately.  He would prefer that option. 4.  Nursing staff will call the patient and let him know how to receive this testing. 5.  Patient to follow in clinic per recommendations after testing received.  Hyacinth Meeker, PA-C [facility] 09/07/2023, 10:52 AM  Cc: Tracey Harries, MD

## 2023-09-08 NOTE — Telephone Encounter (Signed)
 Mr. Maiden picked up his lab order and the SIBO kit today.

## 2023-09-09 ENCOUNTER — Other Ambulatory Visit: Payer: Self-pay | Admitting: Physician Assistant

## 2023-09-10 LAB — H. PYLORI BREATH TEST: H pylori Breath Test: NEGATIVE

## 2023-09-27 ENCOUNTER — Other Ambulatory Visit: Payer: Self-pay | Admitting: Physician Assistant

## 2023-09-27 ENCOUNTER — Telehealth: Payer: Self-pay | Admitting: Physician Assistant

## 2023-09-27 MED ORDER — RIFAXIMIN 550 MG PO TABS: 550.0000 mg | ORAL_TABLET | Freq: Three times a day (TID) | ORAL | 0 refills | Status: DC

## 2023-09-27 NOTE — Progress Notes (Signed)
 Lab results  09/27/23 11:06 AM  SIBO testing shows bacterial overgrowth is suspected.  Will treat with a course of Xifaxan 550 mg 3 times daily x 2 weeks #42 with no refills.  Patient should have follow-up with [place] in 2 to 3 weeks after finishing treatment.  Hyacinth Meeker, PA-C

## 2023-09-27 NOTE — Telephone Encounter (Signed)
 Prescription has been sent per Zachary Barker SIBO results note. The pt has been advised

## 2023-09-27 NOTE — Telephone Encounter (Signed)
 Patient called and stated that Hyacinth Meeker had prescribed him Xifixan 500 mg. I advised the patient that did not show up in our system. Patient is requesting that the medication gets sent over to his local pharmacy [facility] in [place]. Please advise.

## 2023-09-28 ENCOUNTER — Other Ambulatory Visit: Payer: Self-pay | Admitting: Physician Assistant

## 2023-09-28 ENCOUNTER — Telehealth: Payer: Self-pay

## 2023-09-28 ENCOUNTER — Other Ambulatory Visit (HOSPITAL_COMMUNITY): Payer: Self-pay

## 2023-09-28 NOTE — Telephone Encounter (Signed)
**Note De-identified  [place] Obfuscation** Please advise 

## 2023-09-28 NOTE — Telephone Encounter (Signed)
 Pharmacy Patient Advocate Encounter   Received notification from CoverMyMeds that prior authorization for Xifaxan 550MG  tablets is required/requested.   Insurance verification completed.   The patient is insured through [facility] Medicare Part D .   Per test claim: Prior Authorization form/request asks a question that requires your assistance. Please see the question below and advise accordingly. The PA will not be submitted until the necessary information is received.

## 2023-09-28 NOTE — Telephone Encounter (Signed)
 FYI

## 2023-09-28 NOTE — Progress Notes (Signed)
 09/27/2023  3:30 PM  Patient's results for SIBO testing shows bacterial overgrowth is suspected.  Recommend Xifaxan 550 mg 3 times daily x 2 weeks #42 with no refill.  Hyacinth Meeker, PA-C

## 2023-09-29 NOTE — Telephone Encounter (Signed)
 Pharmacy Patient Advocate Encounter   Received notification from CoverMyMeds that prior authorization for Xifaxan 550MG  tablets is required/requested.   Insurance verification completed.   The patient is insured through [facility] Medicare Part D .   Per test claim: PA required; PA submitted to above mentioned insurance via CoverMyMeds Key/confirmation #/EOC [id number] Status is pending

## 2023-09-29 NOTE — Telephone Encounter (Signed)
 Pharmacy Patient Advocate Encounter  Received notification from [facility] Medicare Part D that Prior Authorization for Xifaxan 550MG  tablets has been APPROVED from 09-29-2023 to 10-13-2023   PA #/Case ID/Reference #: [id number]

## 2023-09-30 ENCOUNTER — Telehealth: Payer: Self-pay

## 2023-09-30 NOTE — Telephone Encounter (Signed)
 I spoke to Zachary Barker and I advised him of his results.  I told him that Zachary Barker recommended that he follow up with [place] in office 2 weeks after he finishes his medication.  He said that he will call back to schedule because he is waiting to hear back from the pharmacy.

## 2023-09-30 NOTE — Telephone Encounter (Signed)
-----   Message from Unk Lightning sent at 09/27/2023 11:07 AM EDT ----- Regarding: SIBO test positive Please call patient and let him know the SIBO test was positive.  Lets prescribe Xifaxan 550 mg 3 times daily x 2 weeks #42 with no refill.  He will also need to follow-up in clinic within that from Dr. Urban Gibson team 2 to 3 weeks after finishing antibiotics.  Thanks, JL L

## 2023-10-06 ENCOUNTER — Encounter: Payer: Self-pay | Admitting: Physician Assistant

## 2023-10-20 ENCOUNTER — Telehealth: Payer: Self-pay | Admitting: Physician Assistant

## 2023-10-20 NOTE — Telephone Encounter (Signed)
 Left message for patient to call back. Need to confirm with pt if he can attend virtual visit 5/20 at 8:10 am with [facility].

## 2023-10-20 NOTE — Telephone Encounter (Signed)
 Dr Venice Gillis pt POD B- last seen Bridgette Campus over a month ago.

## 2023-10-20 NOTE — Telephone Encounter (Signed)
 Patient called and stated that he was wanting to make an appointment with Reginal Capra as a virtual visit. Patient is requesting a call back. Please advise.

## 2023-10-20 NOTE — Telephone Encounter (Signed)
 Patient to be transitioned to Pod B per Reginal Capra, PA. Inquiring of Pod B APP who can do virtual visits.

## 2023-10-20 NOTE — Telephone Encounter (Signed)
 Confirmed appointment with patient.

## 2023-10-21 IMAGING — CT CT ABD-PELV W/ CM
2 of 5 series · 16 of 46 positions shown, 18 images · IV contrast (Omnipaque)
Comparison: None

CLINICAL DATA: Upper abdominal pain, fatty liver

EXAM:
CT ABDOMEN AND PELVIS WITH CONTRAST
TECHNIQUE: Multidetector CT imaging of the abdomen and pelvis was performed
using the standard protocol following bolus administration of
intravenous contrast.
CONTRAST:  100mL OMNIPAQUE IOHEXOL 300 MG/ML  SOLN

[Series 2: axial st · axial · 0.90mm/px · z∈[-532,-52]mm · 13 of 110 slices shown, 15 images]
[im 7/110  soft-tissue]
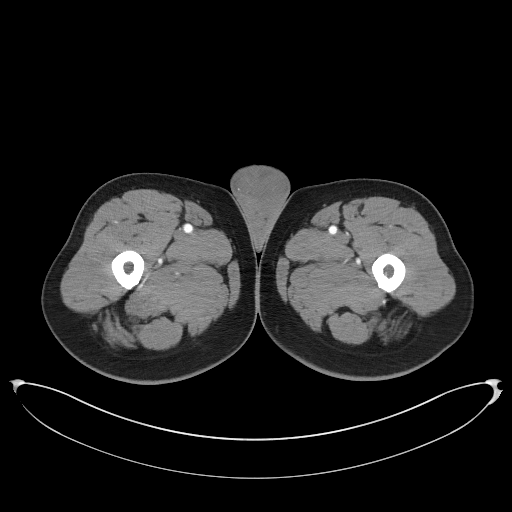
[im 7/110  bone]
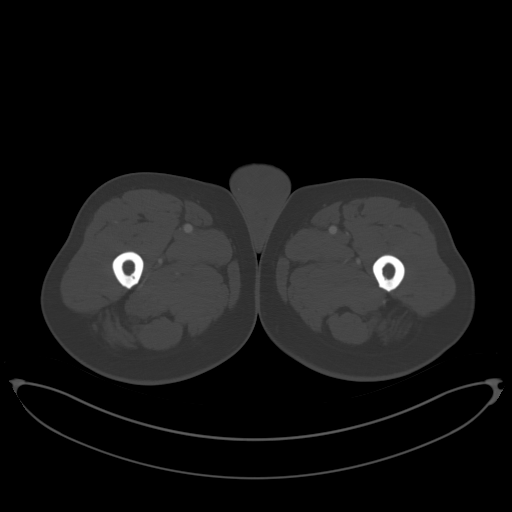
[im 13/110  soft-tissue]
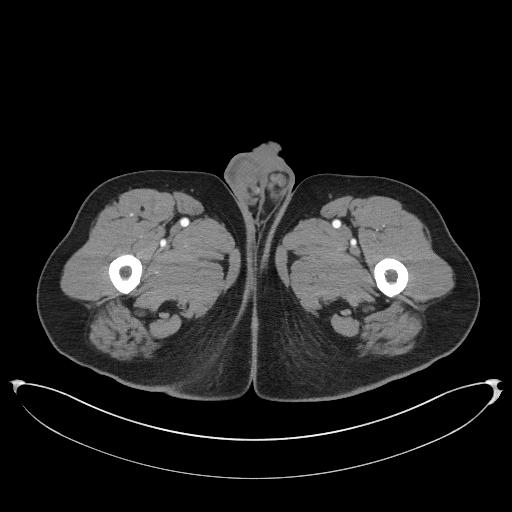
[im 25/110  soft-tissue]
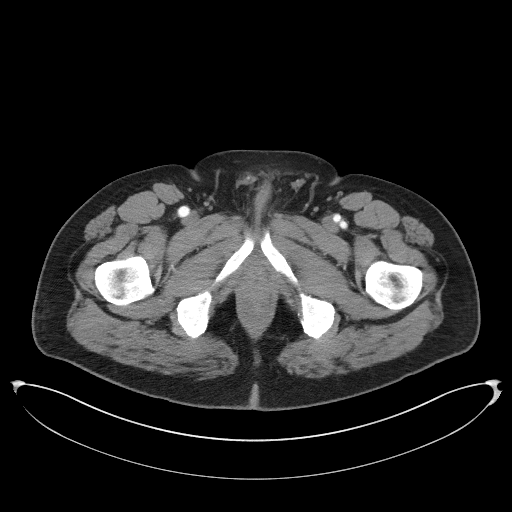
[im 31/110  soft-tissue]
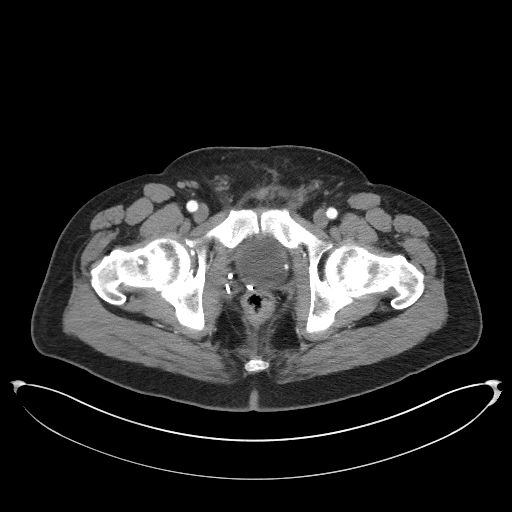
[im 37/110  soft-tissue]
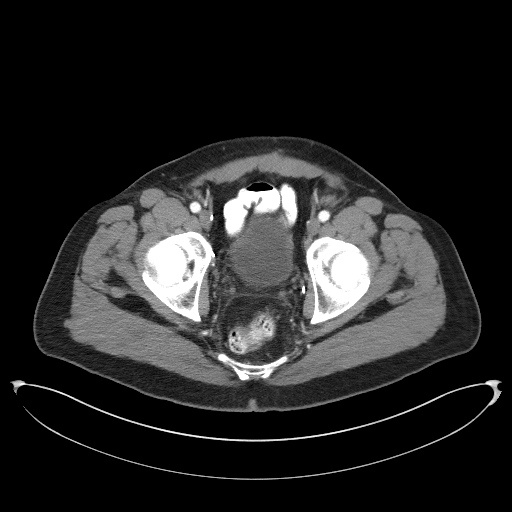
[im 49/110  soft-tissue]
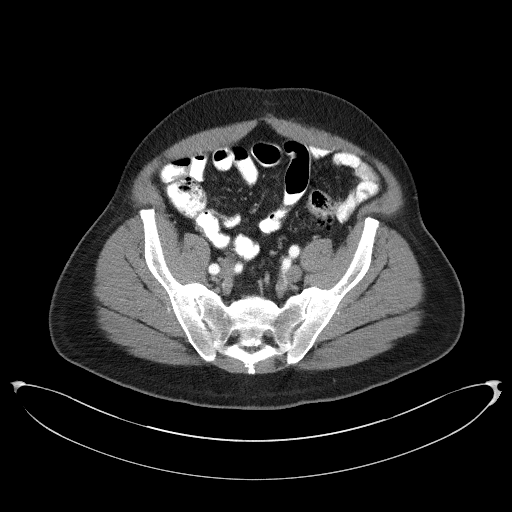
[im 55/110  soft-tissue]
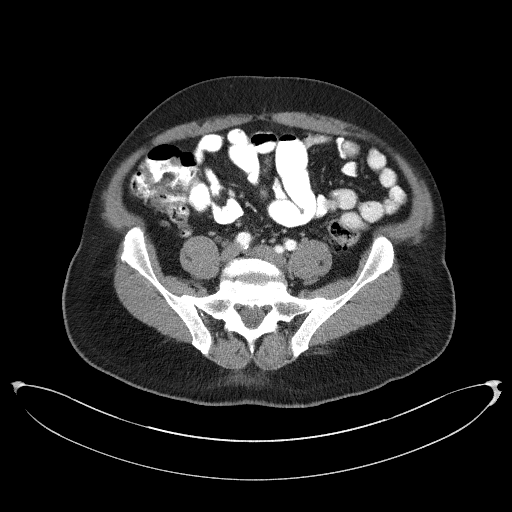
[im 61/110  soft-tissue]
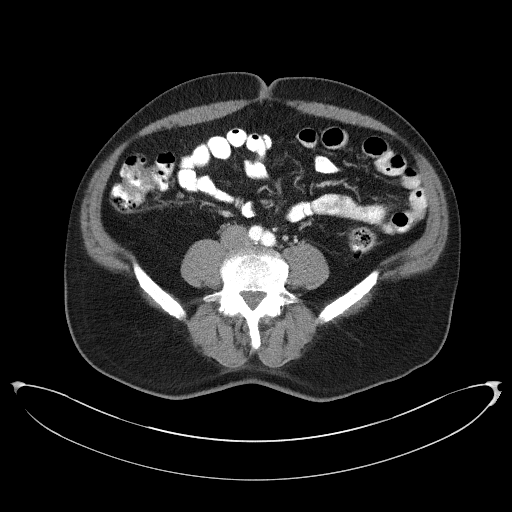
[im 73/110  soft-tissue]
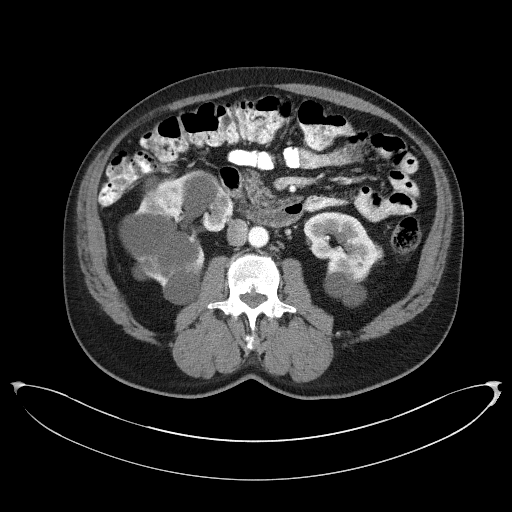
[im 73/110  bone]
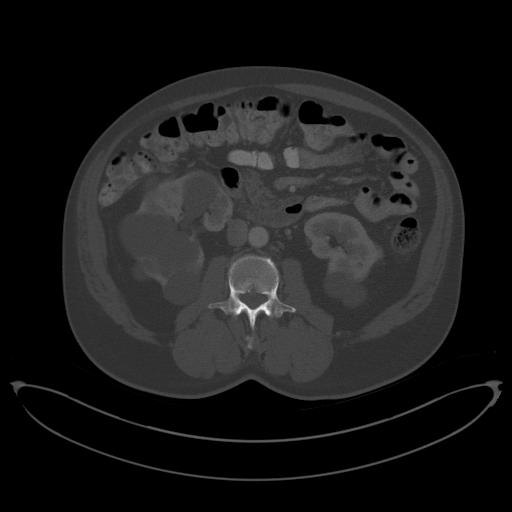
[im 79/110  soft-tissue]
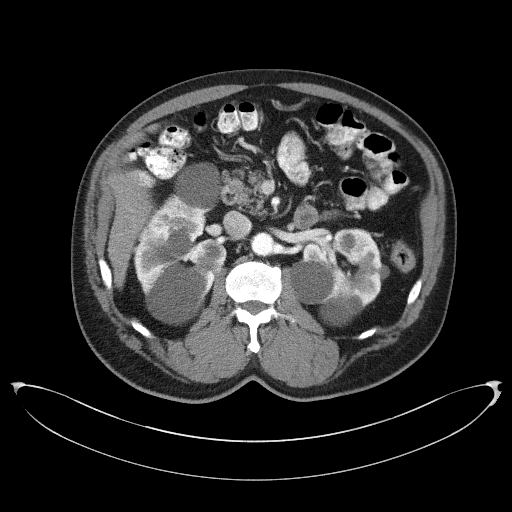
[im 85/110  soft-tissue]
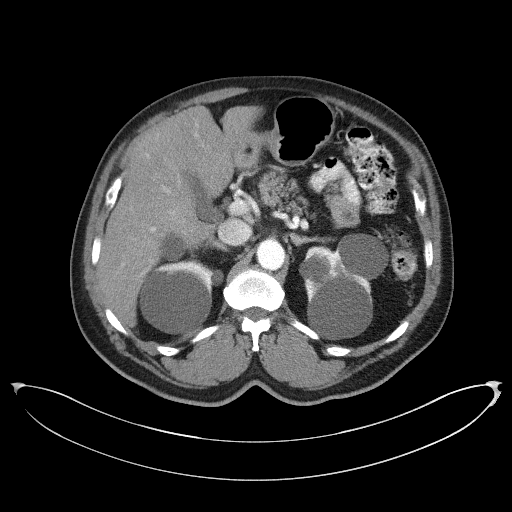
[im 97/110  soft-tissue]
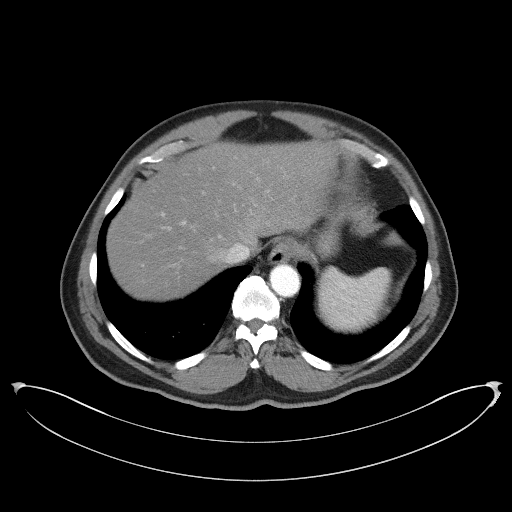
[im 103/110  soft-tissue]
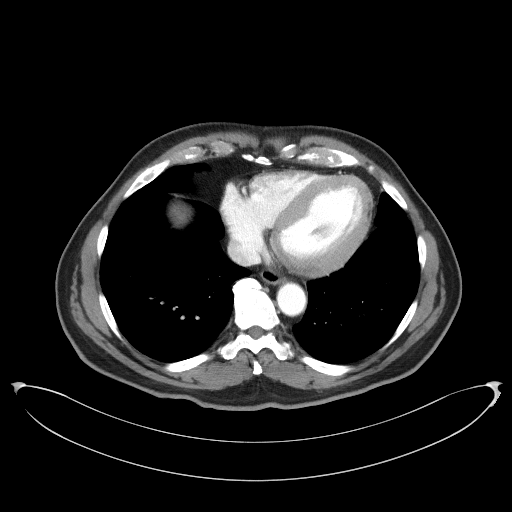

[Series 5: coronal st · coronal · 0.82mm/px · 3 of 104 slices shown]
[im 35/104  soft-tissue]
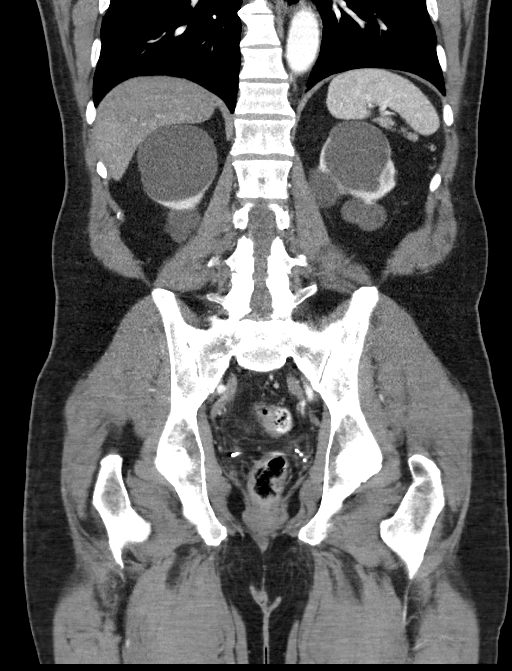
[im 46/104  soft-tissue]
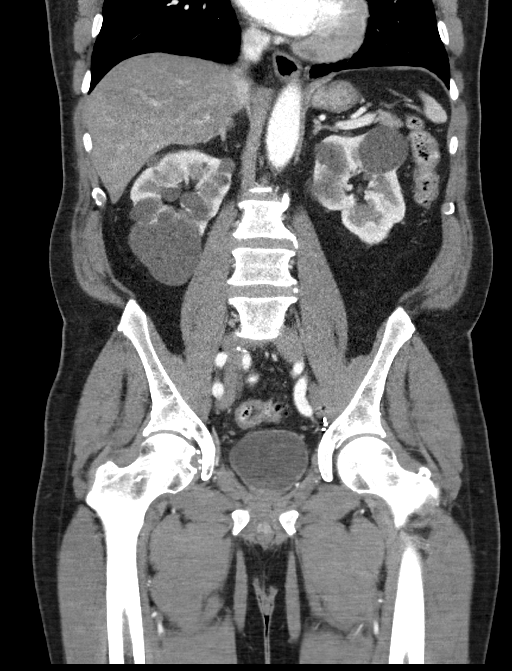
[im 58/104  soft-tissue]
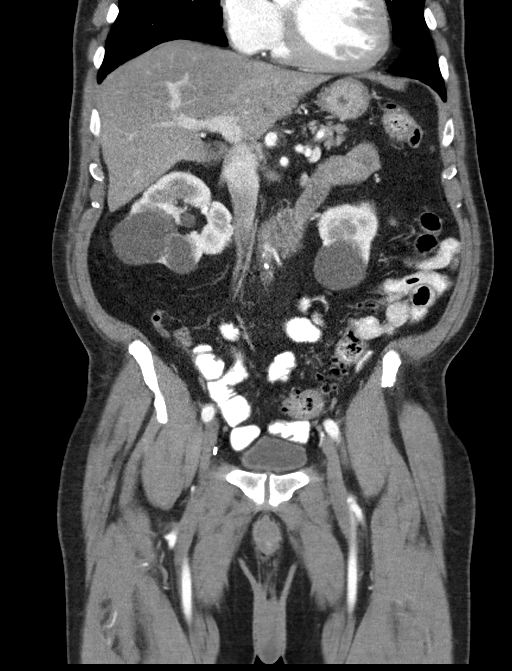

[16 of 46 positions shown; findings below may reference images not displayed]

FINDINGS: Lower chest: Lung bases are clear.

Hepatobiliary: No focal hepatic lesion. No biliary duct dilatation.
Common bile duct is normal.

Pancreas: Pancreas is normal. No ductal dilatation. No pancreatic
inflammation.

Spleen: Normal spleen

Adrenals/urinary tract: Adrenal glands normal. There are multiple
bilateral simple fluid attenuation renal cysts. The number of cysts
suggests polycystic renal disease.

Ureters and bladder normal.

Stomach/Bowel: Stomach, small bowel, appendix, and cecum are normal.
Multiple diverticula of the descending colon and sigmoid colon
without acute inflammation.

Vascular/Lymphatic: Abdominal aorta is normal caliber. No periportal
or retroperitoneal adenopathy. No pelvic adenopathy.

Reproductive: Post prostatectomy

Other: No free fluid.

Musculoskeletal: No aggressive osseous lesion.
IMPRESSION: 1. No acute findings in the abdomen pelvis.
2. LEFT colon/sigmoid colon diverticulosis without acute
diverticulitis.
3. Multiple benign renal cysts. The number of cysts suggests
polycystic kidney disease.
4. Post prostatectomy.

## 2023-11-14 NOTE — Progress Notes (Signed)
 Virtual Visit via Video Note  I connected with Zachary Barker on 11/14/23 at  8:10 AM EDT by a video enabled telemedicine application and verified that I am speaking with the correct person using two identifiers. Please see TE from 09/07/2023 for consent which serves for 365 days.   Location: Patient: home Provider: office   I discussed the assessment and treatment plan with the patient. The patient was provided an opportunity to ask questions and all were answered. The patient agreed with the plan and demonstrated an understanding of the instructions. The patient was advised to call back or seek an in-person evaluation if the symptoms worsen or if the condition fails to improve as anticipated.  I provided 18 minutes of non-face-to-face time during this encounter.  11/14/2023 Zachary Barker [id number] 08-31-1945  PCP:  Primary GI doctor: Dr. 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 pylori treated with eradication study 2023 CT abdomen pelvis diverticulosis without diverticulitis Possible IBS, SIBO, pelvic floor dysfunction Responded to xifaxin, has done well on ABX Has incomplete Bm's,  Has cut out dairy, gluten - Will repeat SIBO if Xifaxan  is not covered we will do metronidazole  with a refill 250 mg 3 times daily - Will refer to pelvic floor physical therapy and [place] where patient is located  SIBO Has always responded well to antibiotics last time with Xifaxan  patient did have response but it was short limiting currently on amoxicillin  for strep throat 2 to 4 weeks after stopping amoxicillin  we will try to do Xifaxan  again if this is not covered can do metronidazole  250 mg 3 times daily for 10 days with 1 refill.  Colonoscopy  Unremarkable 2021 [place]  Recall 2031  History of Present Illness:  78 y.o. male  with a past medical history as listed  below, evaluated for via video for bloating and diarrhea. Last seen via video visit 09/07/2023 with Reginal Capra  06/25/2021 office visit with Dr. Venice Gillis for upper abdominal pain and IBS-D with bloating.  At that time reported negative colonoscopy in [place]  in 2021.  Labs were ordered including CBC, CMP, CRP and TSH as well as celiac panel.  EGD recommended with small bowel biopsies.  CT ordered. 11/23/2020 celiac testing negative.  TSH normal.  CRP normal.  07/09/2021 CT that and pelvis with left colon/sigmoid colon diverticulosis without acute diverticulitis and multiple benign renal cyst as well as post prostatectomy.  07/29/2021 EGD with a few minimal esophageal plaques and distal esophageal erosions.  Biopsy showed chronic gastritis and chronic esophagitis as well as positive for H. pylori.  Patient treated at that time with triple therapy.  09/14/2021 H. pylori stool antigen negative. Patient had negative breath test SIBO testing showed bacterial overgrowth treated with Xifaxan  550 mg 3 times daily for 2 weeks  Discussed the use of AI scribe software for clinical note transcription with the patient, who gave verbal consent to proceed.  History of Present Illness   Zachary Barker "Zachary Barker" is a 78 year old male with a history of prostate cancer who presents with gastrointestinal symptoms and bowel habit changes.  He experiences frequent bowel movements that are partial and sometimes semi-constipated. His bowel movements are often incomplete, accompanied by significant gas, and sometimes very thin and narrow. No blood in the stool, unintentional weight loss, nausea, vomiting, or upper gastrointestinal symptoms. He has intentionally lost 30 pounds and currently weighs 170 pounds at a height  of 5'9".  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 He was treated with Xifaxan  for two weeks, which improved his symptoms, but they returned shortly  after stopping the medication. He is currently on amoxicillin  for strep throat.  He follows a vegan diet, consuming a lot of fruits and vegetables, and avoids junk food, dairy, and sweets. He does not consume alcohol or smoke, and he has not done so since college.  He is currently taking telmisartan for blood pressure, which he suspects affects his bowel movements. He has experienced both constipation and diarrhea in the past.  He reports more frequent urination than normal but denies any urinary hesitancy, dribbling, or urgency.       Current Medications:    Current Outpatient Medications (Cardiovascular):    telmisartan (MICARDIS) 40 MG tablet, Take 1 tablet by mouth daily.     Current Outpatient Medications (Other):    rifaximin  (XIFAXAN ) 550 MG TABS tablet, Take 1 tablet (550 mg total) by mouth 3 (three) times daily.  Surgical History:  He  has a past surgical history that includes Prostate surgery; Hernia repair; and Upper gastrointestinal endoscopy. Family History:  His family history includes Bladder Cancer in his mother; Diabetes in his paternal grandfather and paternal grandmother; Heart disease in his mother; Prostate cancer in his father. Social History:   reports that he has never smoked. He has never used smokeless tobacco. He reports that he does not currently use alcohol. He reports that he does not use drugs.  Current Medications, Allergies, Past Medical History, Past Surgical History, Family History and Social History were reviewed in [employer] record.  Physical Exam: There were no vitals taken for this visit.  Did you General Appearance: Well nourished well developed, in no apparent distress.  Eyes: conjunctiva no swelling or erythema ENT/Mouth: No hoarseness, No cough for duration of visit.  Neck: Supple  Respiratory: Respiratory effort normal, normal rate, no retractions or distress.   Cardio: Appears well-perfused,  noncyanotic Musculoskeletal: no obvious deformity Skin: visible skin without rashes, ecchymosis, erythema Neuro: Awake and oriented X 3,  Psych:  normal affect, Insight and Judgment appropriate.     Edmonia Gottron, PA-C 11/14/23

## 2023-11-15 ENCOUNTER — Other Ambulatory Visit (HOSPITAL_COMMUNITY): Payer: Self-pay

## 2023-11-15 ENCOUNTER — Telehealth (INDEPENDENT_AMBULATORY_CARE_PROVIDER_SITE_OTHER): Admitting: Physician Assistant

## 2023-11-15 ENCOUNTER — Telehealth: Payer: Self-pay

## 2023-11-15 DIAGNOSIS — K638219 Small intestinal bacterial overgrowth, unspecified: Secondary | ICD-10-CM

## 2023-11-15 DIAGNOSIS — Z8546 Personal history of malignant neoplasm of prostate: Secondary | ICD-10-CM | POA: Diagnosis not present

## 2023-11-15 DIAGNOSIS — K580 Irritable bowel syndrome with diarrhea: Secondary | ICD-10-CM | POA: Diagnosis not present

## 2023-11-15 DIAGNOSIS — R140 Abdominal distension (gaseous): Secondary | ICD-10-CM

## 2023-11-15 DIAGNOSIS — R143 Flatulence: Secondary | ICD-10-CM

## 2023-11-15 MED ORDER — RIFAXIMIN 550 MG PO TABS: 550.0000 mg | ORAL_TABLET | Freq: Three times a day (TID) | ORAL | 0 refills | Status: AC

## 2023-11-15 NOTE — Telephone Encounter (Signed)
 Pharmacy Patient Advocate Encounter   Received notification from CoverMyMeds that prior authorization for Xifaxan  550MG  tablets is required/requested.   Insurance verification completed.   The patient is insured through [facility] Medicare Part D .   Per test claim: PA required; PA submitted to above mentioned insurance via CoverMyMeds Key/confirmation #/EOC [facility] Status is pending

## 2023-11-15 NOTE — Telephone Encounter (Signed)
 Pharmacy Patient Advocate Encounter  Received notification from [employer] Medicare Part D that Prior Authorization for Xifaxan  550MG  tablets has been APPROVED from 11-15-2023 to 11-29-2023   PA #/Case ID/Reference #: [id number]

## 2023-11-15 NOTE — Patient Instructions (Signed)
 Sent message

## 2023-12-06 ENCOUNTER — Telehealth: Payer: Self-pay

## 2023-12-06 NOTE — Telephone Encounter (Signed)
 I spoke to Zachary Barker and asked if he had been contacted by [facility] to schedule his PFT.  He advised no.   The first fax was sent on 6/02 and I resent it today.  I told Mr. Baugh that I will give them a call to follow up.  He scheduled his 3 month visit with Santina Cull, PA-C.

## 2023-12-06 NOTE — Progress Notes (Signed)
 Referral faxed 11/28/23 and 12/06/23.

## 2023-12-26 NOTE — Telephone Encounter (Signed)
 Lmtcb about scheduling a mutual patient.

## 2023-12-29 NOTE — Telephone Encounter (Signed)
 I called [facility] PFT and they advised that he has been scheduled for 7/31.

## 2024-02-07 ENCOUNTER — Ambulatory Visit: Admitting: Physician Assistant
